# Patient Record
Sex: Female | Born: 1995 | Race: White | Hispanic: No | Marital: Single | Smoking: Never smoker
Health system: Southern US, Community
[De-identification: ages and names within clinical notes are randomized; demographics above are authoritative.]

## PROBLEM LIST (undated history)

## (undated) DIAGNOSIS — M84369A Stress fracture, unspecified tibia and fibula, initial encounter for fracture: Secondary | ICD-10-CM

## (undated) HISTORY — DX: Stress fracture, unspecified tibia and fibula, initial encounter for fracture: M84.369A

## (undated) HISTORY — PX: OTHER SURGICAL HISTORY: SHX169

---
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
## 2019-02-26 ENCOUNTER — Ambulatory Visit: Payer: BC Managed Care – PPO | Attending: Internal Medicine

## 2019-02-26 DIAGNOSIS — Z20822 Contact with and (suspected) exposure to covid-19: Secondary | ICD-10-CM

## 2019-02-27 LAB — NOVEL CORONAVIRUS, NAA: SARS-CoV-2, NAA: NOT DETECTED
